# Patient Record
Sex: Male | Born: 2000
Health system: Southern US, Community
[De-identification: ages and names within clinical notes are randomized; demographics above are authoritative.]

## PROBLEM LIST (undated history)

## (undated) DIAGNOSIS — R51 Headache: Secondary | ICD-10-CM

## (undated) HISTORY — DX: Headache: R51

## (undated) HISTORY — PX: ADENOIDECTOMY: SHX5191

## (undated) HISTORY — PX: ADENOIDECTOMY: SUR15

---
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
## 2022-05-22 IMAGING — DX DG THORACIC SPINE 2V
2 series · 2 of 2 positions shown · non-contrast
Comparison: Chest x-ray 05/19/2017

CLINICAL DATA: Back pain

EXAM:
THORACIC SPINE 2 VIEWS

[t-spine ap]
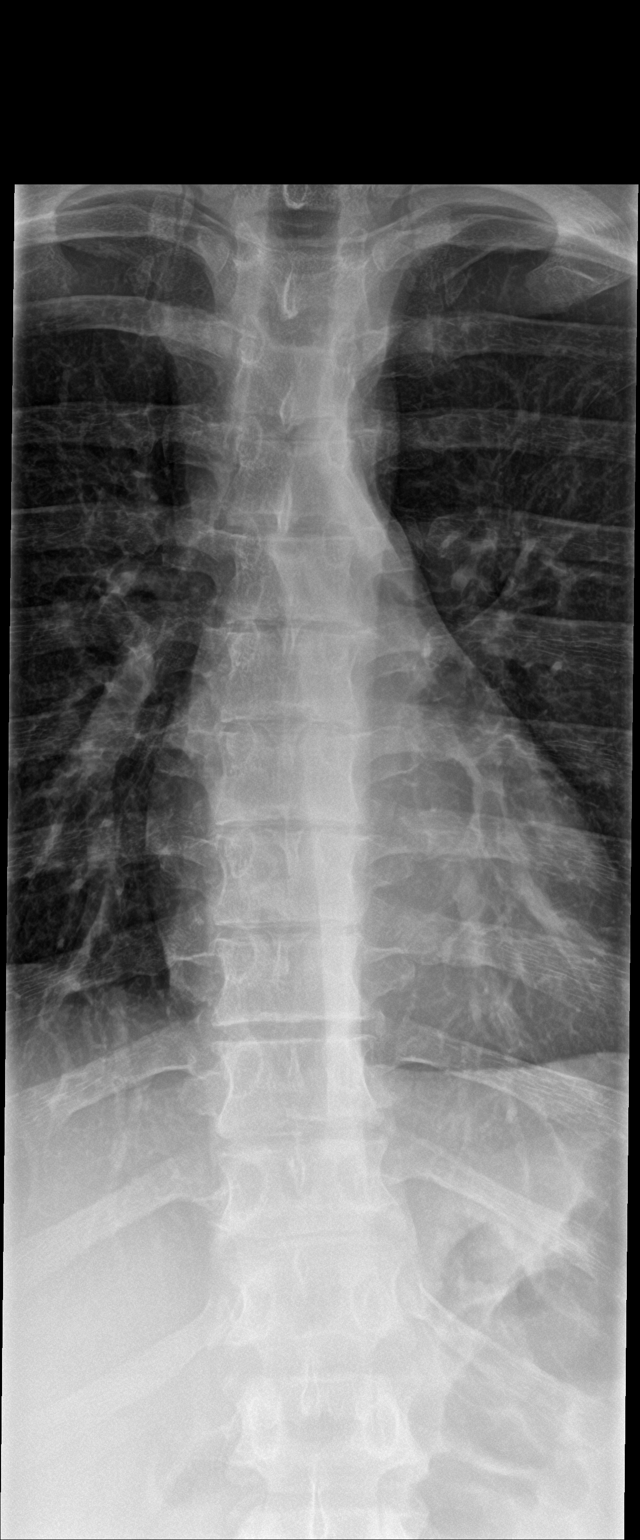

[t-spine lat]
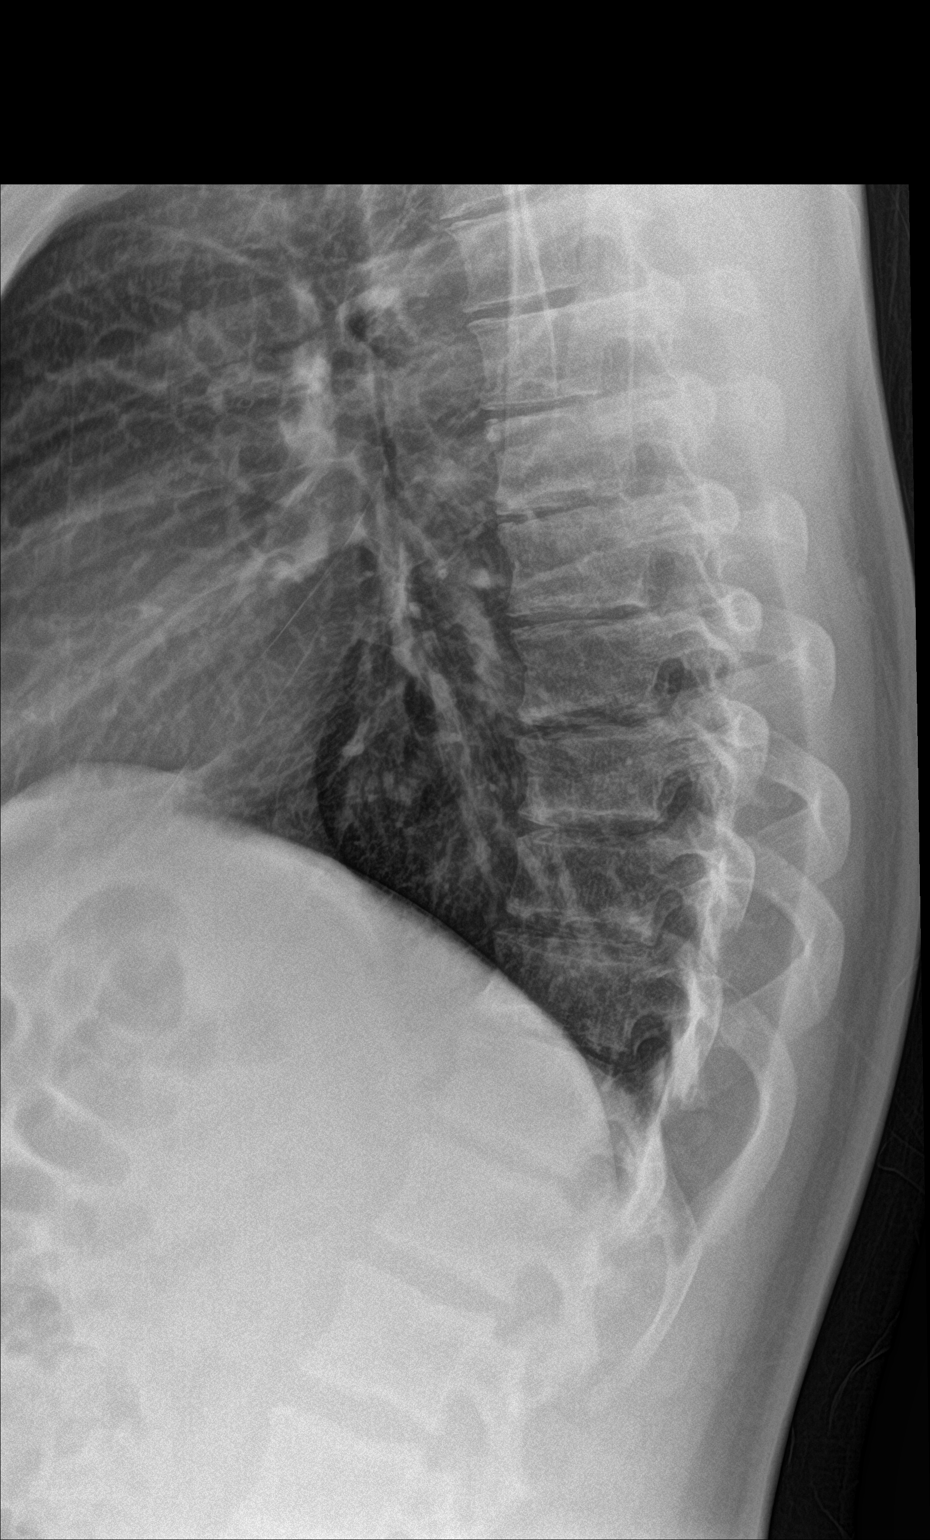

[2 of 2 positions shown; findings below may reference images not displayed]

FINDINGS: Thoracic alignment within normal limits. Possible mild wedging at
T11. Remaining vertebral bodies demonstrate normal stature. The disc
spaces appear within normal limits.
IMPRESSION: Possible mild wedging at T11.  Otherwise negative examination

## 2022-05-29 ENCOUNTER — Emergency Department (HOSPITAL_COMMUNITY): Payer: Medicaid Other

## 2022-05-29 ENCOUNTER — Other Ambulatory Visit: Payer: Self-pay

## 2022-05-29 ENCOUNTER — Emergency Department (HOSPITAL_COMMUNITY)
Admission: EM | Admit: 2022-05-29 | Discharge: 2022-05-29 | Disposition: A | Discharge status: Home / Self Care | Payer: Medicaid Other | Attending: Emergency Medicine | Admitting: Emergency Medicine

## 2022-05-29 ENCOUNTER — Encounter (HOSPITAL_COMMUNITY): Payer: Self-pay | Admitting: *Deleted

## 2022-05-29 DIAGNOSIS — Y92410 Unspecified street and highway as the place of occurrence of the external cause: Secondary | ICD-10-CM | POA: Insufficient documentation

## 2022-05-29 DIAGNOSIS — Z23 Encounter for immunization: Secondary | ICD-10-CM | POA: Insufficient documentation

## 2022-05-29 DIAGNOSIS — S022XXA Fracture of nasal bones, initial encounter for closed fracture: Secondary | ICD-10-CM | POA: Diagnosis not present

## 2022-05-29 DIAGNOSIS — Z91010 Allergy to peanuts: Secondary | ICD-10-CM | POA: Diagnosis not present

## 2022-05-29 DIAGNOSIS — Y9389 Activity, other specified: Secondary | ICD-10-CM | POA: Diagnosis not present

## 2022-05-29 DIAGNOSIS — S0012XA Contusion of left eyelid and periocular area, initial encounter: Secondary | ICD-10-CM | POA: Insufficient documentation

## 2022-05-29 DIAGNOSIS — M542 Cervicalgia: Secondary | ICD-10-CM | POA: Diagnosis not present

## 2022-05-29 DIAGNOSIS — S52612A Displaced fracture of left ulna styloid process, initial encounter for closed fracture: Secondary | ICD-10-CM | POA: Insufficient documentation

## 2022-05-29 DIAGNOSIS — S0992XA Unspecified injury of nose, initial encounter: Secondary | ICD-10-CM | POA: Diagnosis present

## 2022-05-29 DIAGNOSIS — S0121XA Laceration without foreign body of nose, initial encounter: Secondary | ICD-10-CM

## 2022-05-29 MED ORDER — LIDOCAINE HCL (PF) 1 % IJ SOLN
10.0000 mL | Freq: Once | INTRAMUSCULAR | Status: AC
  Administered 2022-05-29: 10 mL
  Filled 2022-05-29: qty 10

## 2022-05-29 MED ORDER — CLINDAMYCIN HCL 300 MG PO CAPS: 300.0000 mg | ORAL_CAPSULE | Freq: Three times a day (TID) | ORAL | 0 refills | Status: DC

## 2022-05-29 MED ORDER — CLINDAMYCIN HCL 300 MG PO CAPS: 300.0000 mg | ORAL_CAPSULE | Freq: Three times a day (TID) | ORAL | 0 refills | Status: AC

## 2022-05-29 MED ORDER — TETANUS-DIPHTH-ACELL PERTUSSIS 5-2.5-18.5 LF-MCG/0.5 IM SUSY
0.5000 mL | PREFILLED_SYRINGE | Freq: Once | INTRAMUSCULAR | Status: AC
  Administered 2022-05-29: 0.5 mL via INTRAMUSCULAR
  Filled 2022-05-29: qty 0.5

## 2022-05-29 MED ORDER — OXYCODONE-ACETAMINOPHEN 5-325 MG PO TABS
1.0000 | ORAL_TABLET | ORAL | Status: DC | PRN
  Administered 2022-05-29: 1 via ORAL
  Filled 2022-05-29: qty 1

## 2022-05-29 MED ORDER — OXYCODONE-ACETAMINOPHEN 5-325 MG PO TABS: 1.0000 | ORAL_TABLET | Freq: Four times a day (QID) | ORAL | 0 refills | Status: AC | PRN
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
## 2022-05-29 NOTE — Progress Notes (Signed)
Orthopedic Tech Progress Note Patient Details:  Kenneth Moss [facility] [birth year] [id number] A well padded posterior long arm splint was applied to this patient's left arm. An XL arm sling was applied afterwards. Ortho Devices Type of Ortho Device: Arm sling, Long arm splint Ortho Device/Splint Location: Left arm Ortho Device/Splint Interventions: Application   Post Interventions Patient Tolerated: Well  [name] 05/29/2022, 9:21 AM
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
## 2023-03-08 IMAGING — CR DG CHEST 2V
2 series · 2 of 2 positions shown · non-contrast
Comparison: 05/19/2017

CLINICAL DATA: Cough

EXAM:
CHEST - 2 VIEW

[chest pa]
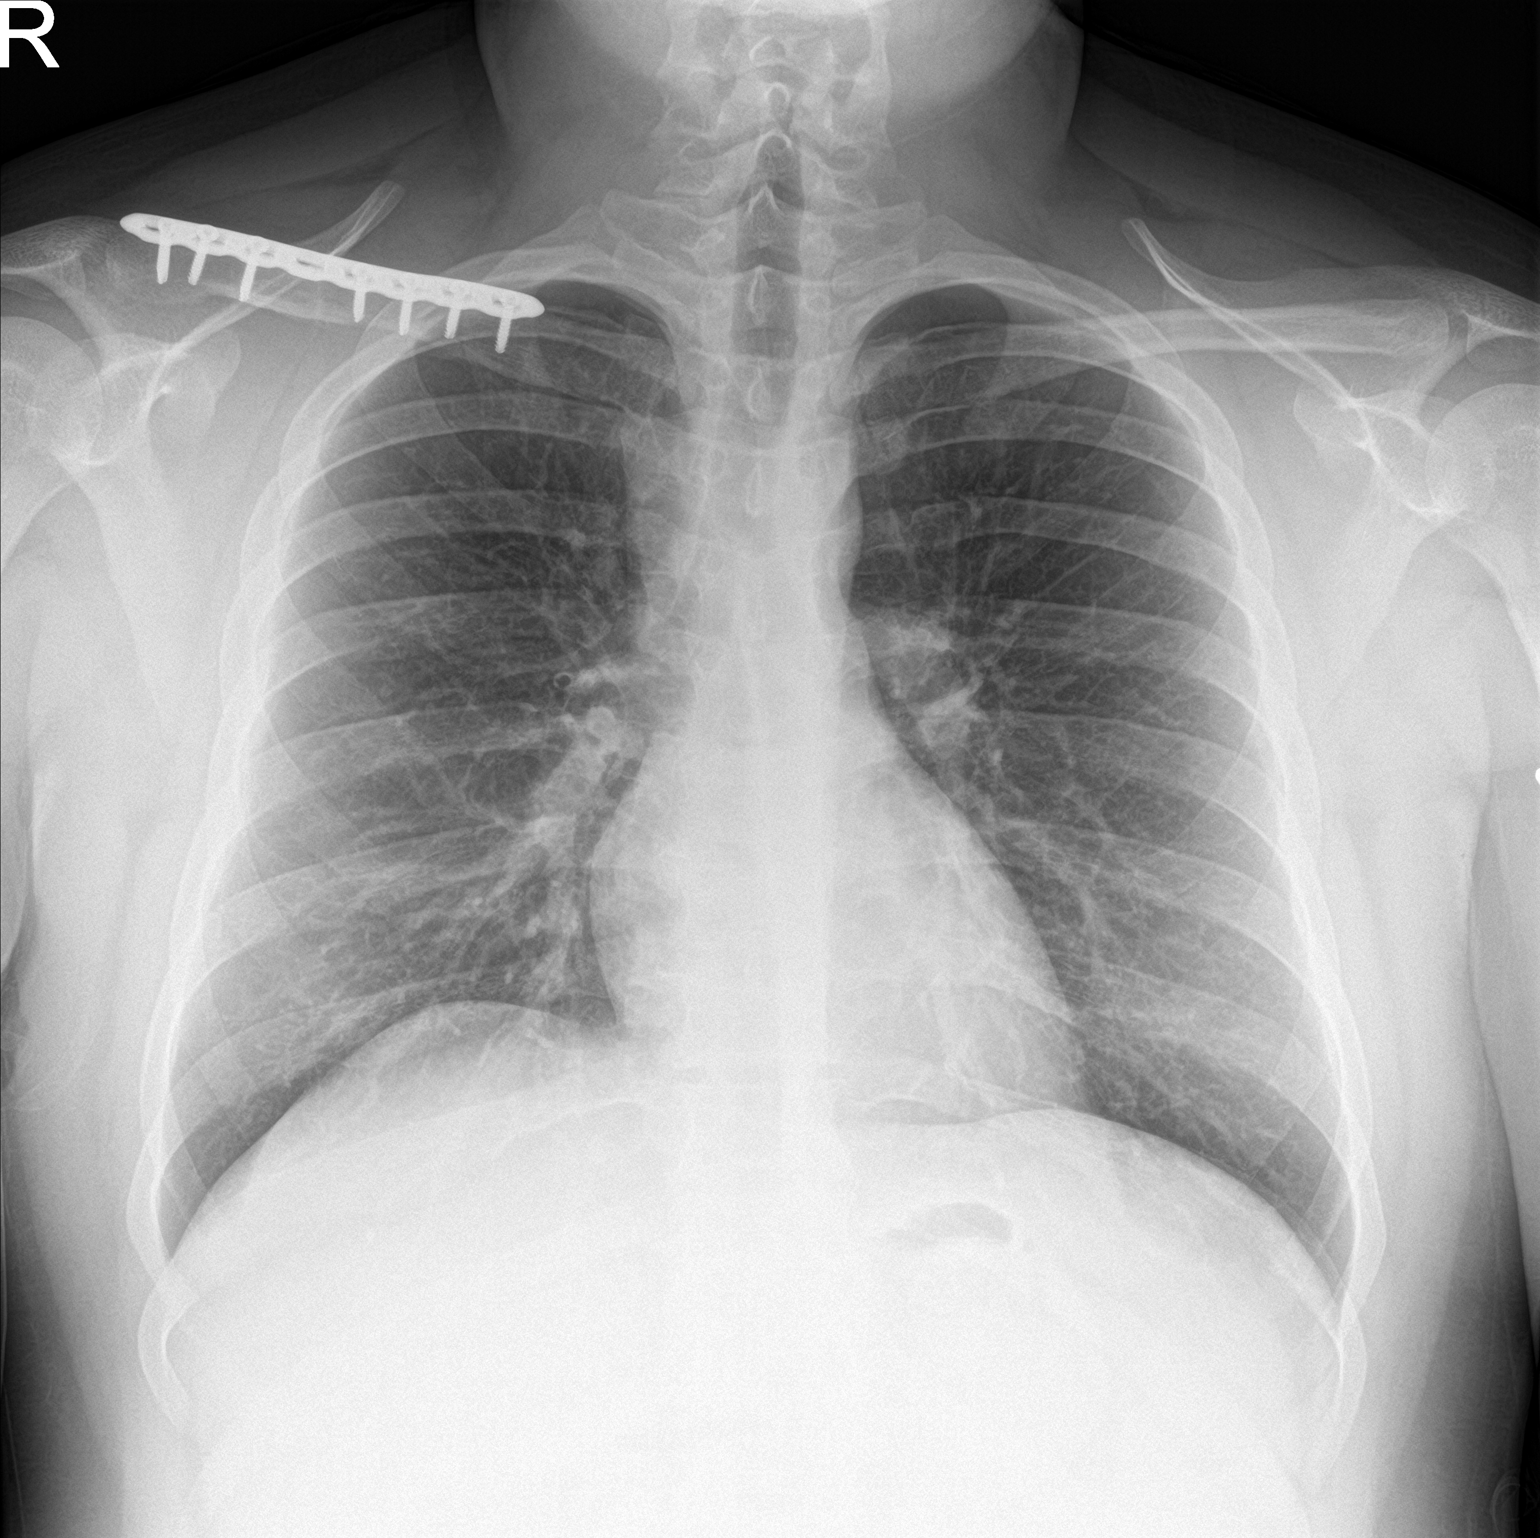

[chest lat]
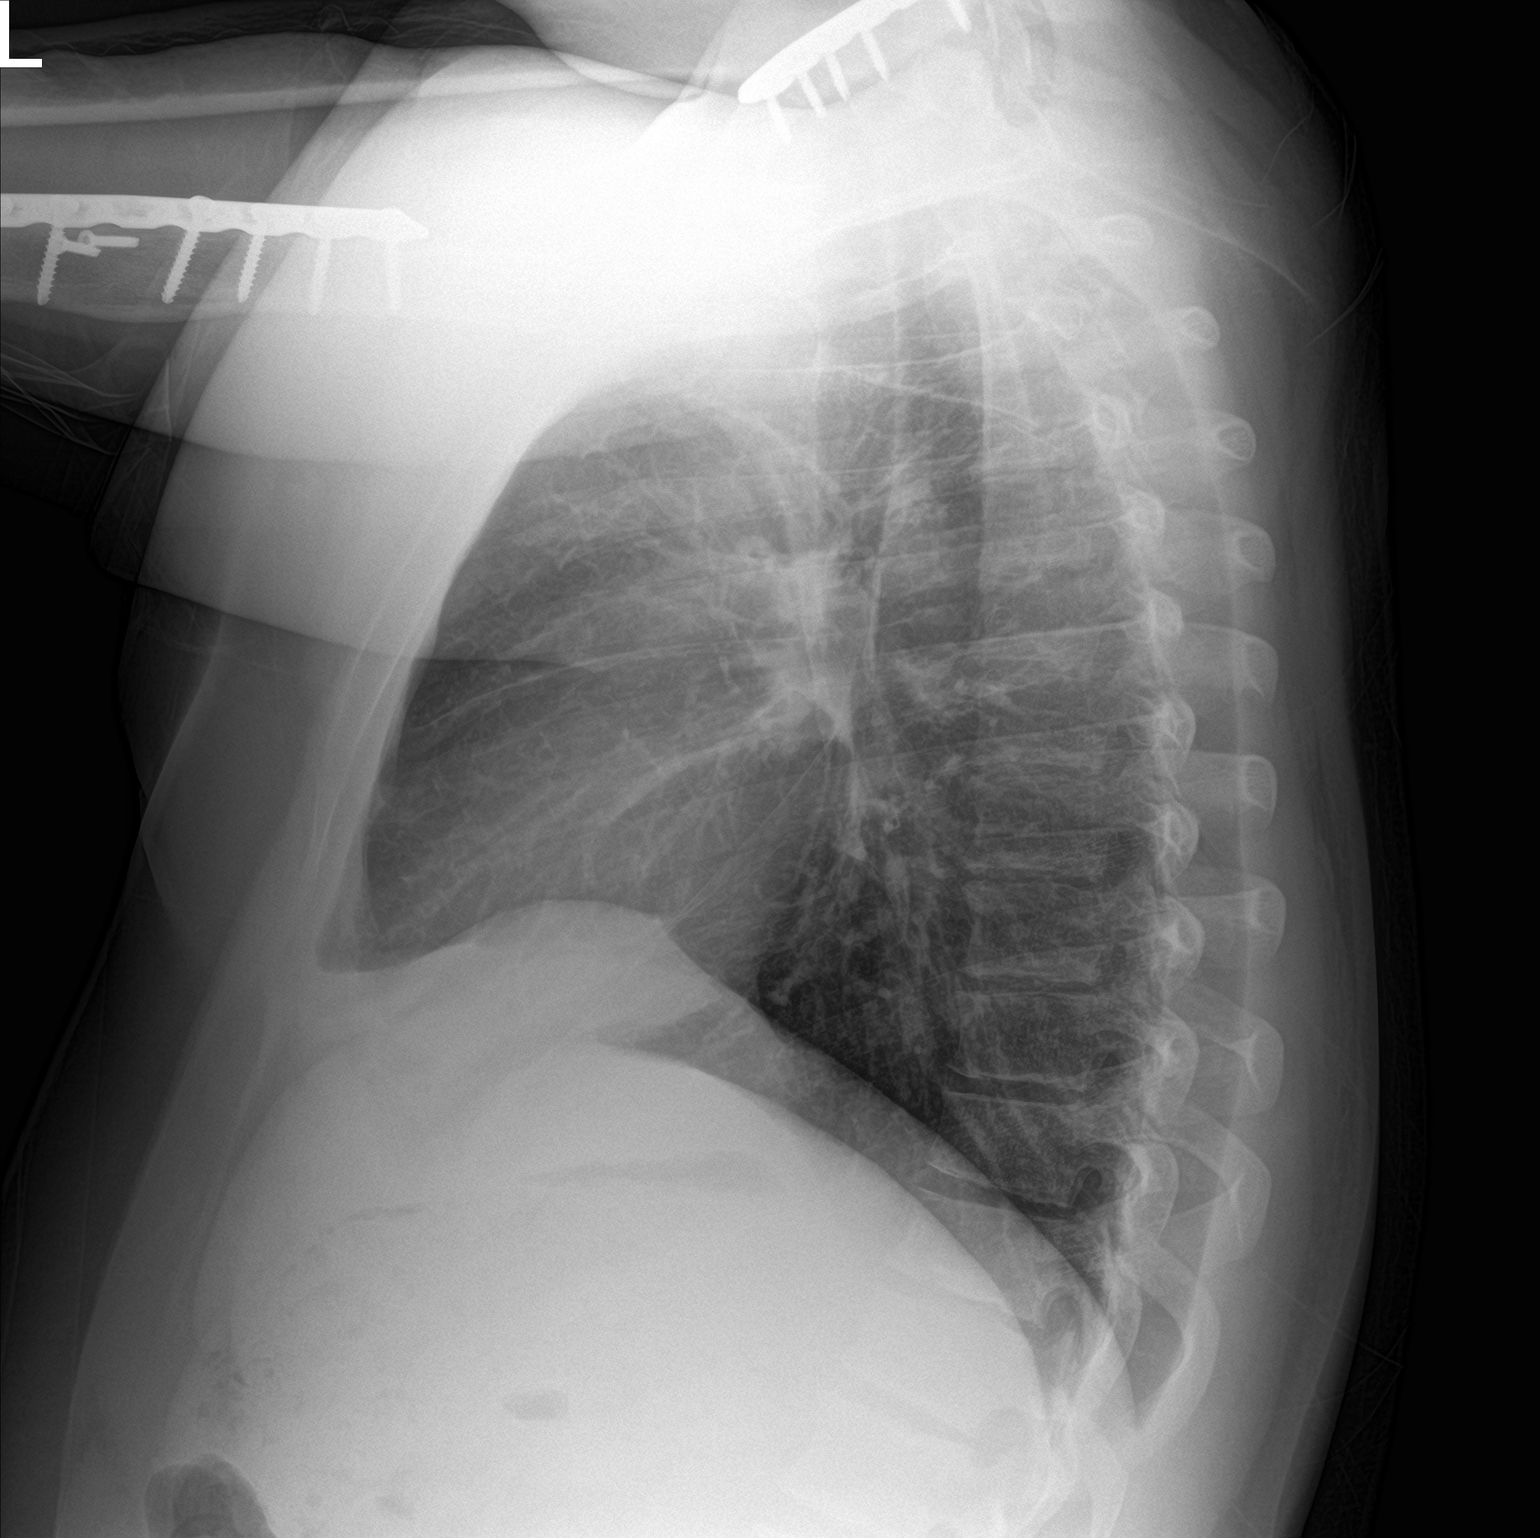

[2 of 2 positions shown; findings below may reference images not displayed]

FINDINGS: Lungs are clear. No pneumothorax or pleural effusion. Cardiac size
within normal limits. Pulmonary vascularity is normal. Interval
right clavicle ORIF. No acute bone abnormality.
IMPRESSION: No active cardiopulmonary disease.

## 2024-03-05 ENCOUNTER — Ambulatory Visit
Admission: EM | Admit: 2024-03-05 | Discharge: 2024-03-05 | Disposition: A | Discharge status: Home / Self Care | Attending: Emergency Medicine | Admitting: Emergency Medicine

## 2024-03-05 ENCOUNTER — Encounter: Payer: Self-pay | Admitting: Emergency Medicine

## 2024-03-05 DIAGNOSIS — R030 Elevated blood-pressure reading, without diagnosis of hypertension: Secondary | ICD-10-CM | POA: Diagnosis not present

## 2024-03-05 DIAGNOSIS — J4521 Mild intermittent asthma with (acute) exacerbation: Secondary | ICD-10-CM | POA: Diagnosis not present

## 2024-03-05 MED ORDER — ALBUTEROL SULFATE HFA 108 (90 BASE) MCG/ACT IN AERS: 2.0000 | INHALATION_SPRAY | Freq: Four times a day (QID) | RESPIRATORY_TRACT | 2 refills | Status: AC | PRN

## 2024-03-05 MED ORDER — PREDNISONE 10 MG (21) PO TBPK: ORAL_TABLET | Freq: Every day | ORAL | 0 refills | Status: AC

## 2024-03-05 NOTE — ED Triage Notes (Signed)
 Patient reports high blood pressure that started last night. Patient complains of shortness of breath for 6 months.
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
## 2024-03-05 NOTE — Discharge Instructions (Signed)
 Today you are evaluated for a high blood pressure reading  Today in clinic blood pressure is 126/81 and heart rate is 92 both are within normal ranges  Blood pressure can be altered when in pain and when not feeling it therefore it is important to monitor your blood pressure while you are in a healthy state to determine your actual baseline  Check blood pressure 1-2 times weekly and record when you are in a calm quiet environment until you are evaluated by your primary doctor  On exam your lungs are clear and you are getting enough air without assistance do believe your breathing is flared by your asthma  Inhaler has been prescribed that you may use every 6 hours for wheezing and shortness of breath  Begin prednisone  every morning with food as directed to open and relax the airway which should help to settle breathing faster  Please keep upcoming primary care appointment for further evaluation and management

## 2024-04-12 ENCOUNTER — Ambulatory Visit: Payer: Self-pay | Admitting: General Practice
